# Patient Record
Sex: Female | Born: 1980 | Race: White | Hispanic: No | Marital: Single | Smoking: Current every day smoker
Health system: Southern US, Community
[De-identification: ages and names within clinical notes are randomized; demographics above are authoritative.]

## PROBLEM LIST (undated history)

## (undated) DIAGNOSIS — F32A Depression, unspecified: Secondary | ICD-10-CM

## (undated) DIAGNOSIS — I10 Essential (primary) hypertension: Secondary | ICD-10-CM

## (undated) HISTORY — PX: ABDOMINAL HYSTERECTOMY: SHX81

---

## 2014-01-08 ENCOUNTER — Emergency Department (HOSPITAL_COMMUNITY)
Admission: EM | Admit: 2014-01-08 | Discharge: 2014-01-08 | Disposition: A | Discharge status: Home / Self Care | Payer: Self-pay | Attending: Emergency Medicine | Admitting: Emergency Medicine

## 2014-01-08 ENCOUNTER — Encounter (HOSPITAL_COMMUNITY): Payer: Self-pay | Admitting: Emergency Medicine

## 2014-01-08 DIAGNOSIS — M545 Low back pain, unspecified: Secondary | ICD-10-CM | POA: Insufficient documentation

## 2014-01-08 DIAGNOSIS — Z79899 Other long term (current) drug therapy: Secondary | ICD-10-CM | POA: Insufficient documentation

## 2014-01-08 DIAGNOSIS — M5412 Radiculopathy, cervical region: Secondary | ICD-10-CM | POA: Insufficient documentation

## 2014-01-08 DIAGNOSIS — F17200 Nicotine dependence, unspecified, uncomplicated: Secondary | ICD-10-CM | POA: Insufficient documentation

## 2014-01-08 MED ORDER — IBUPROFEN 600 MG PO TABS: 600.0000 mg | ORAL_TABLET | Freq: Three times a day (TID) | ORAL | Status: AC

## 2014-01-08 MED ORDER — DIAZEPAM 5 MG PO TABS: 5.0000 mg | ORAL_TABLET | Freq: Two times a day (BID) | ORAL | Status: DC

## 2014-01-08 MED ORDER — TRAMADOL HCL 50 MG PO TABS: 50.0000 mg | ORAL_TABLET | Freq: Four times a day (QID) | ORAL | Status: DC | PRN

## 2014-01-08 NOTE — ED Provider Notes (Addendum)
CSN: [id number]     Arrival date & time 01/08/14  5056 History   First MD Initiated Contact with Patient 01/08/14 [phone]     Chief Complaint  Patient presents with  . B/L arm pain       HPI  Patient presents with new pain in both arms, mid back, low back. The pain began 3 days ago, when the patient was assisting with a heavy patient at work.  She is a Engineer, civil (consulting).  Patient strained, felt a sudden onset of pain in her midthoracic, lower back and no neck pain, but with pain in both forearms, both the second and third digits.  The pain is described as stinging, with no loss of strength, no loss of sensation.  No lower extremity changes, no abdominal pain, chest pain, unilateral swelling. Patient was in her usual state of health prior to the onset of symptoms. No relief with OTC medication thus far.   History reviewed. No pertinent past medical history. History reviewed. No pertinent past surgical history. No family history on file. History  Substance Use Topics  . Smoking status: Current Every Day Smoker  . Smokeless tobacco: Not on file  . Alcohol Use: No   OB History   Grav Para Term Preterm Abortions TAB SAB Ect Mult Living                 Review of Systems  Constitutional:       Per HPI, otherwise negative  HENT:       Per HPI, otherwise negative  Respiratory:       Per HPI, otherwise negative  Cardiovascular:       Per HPI, otherwise negative  Gastrointestinal: Negative for vomiting.  Endocrine:       Negative aside from HPI  Genitourinary:       Neg aside from HPI   Musculoskeletal:       Per HPI, otherwise negative  Skin: Negative for color change and pallor.  Neurological: Negative for dizziness, syncope, weakness, numbness and headaches.      Allergies  Codeine  Home Medications   Prior to Admission medications   Medication Sig Start Date End Date Taking? Authorizing Provider  acetaminophen (TYLENOL) 500 MG tablet Take 500 mg by mouth daily.   Yes Historical  Provider, MD  Ascorbic Acid (VITAMIN C) 1000 MG tablet Take 1,000 mg by mouth daily.   Yes Historical Provider, MD  citalopram (CELEXA) 20 MG tablet Take 20 mg by mouth daily.   Yes Historical Provider, MD  lansoprazole (PREVACID) 15 MG capsule Take 15 mg by mouth daily at 12 noon.   Yes Historical Provider, MD  diazepam (VALIUM) 5 MG tablet Take 1 tablet (5 mg total) by mouth 2 (two) times daily. 01/08/14   Gerhard Munch, MD  ibuprofen (ADVIL,MOTRIN) 600 MG tablet Take 1 tablet (600 mg total) by mouth 3 (three) times daily. 01/08/14 01/11/14  Gerhard Munch, MD  traMADol (ULTRAM) 50 MG tablet Take 1 tablet (50 mg total) by mouth every 6 (six) hours as needed. 01/08/14   Gerhard Munch, MD   BP 149/119  Pulse 106  Temp(Src) 98.1 F (36.7 C) (Oral)  Resp 18  SpO2 100%  LMP 01/01/2014 Physical Exam  Nursing note and vitals reviewed. Constitutional: She is oriented to person, place, and time. She appears well-developed and well-nourished. No distress.  HENT:  Head: Normocephalic and atraumatic.  Eyes: Conjunctivae and EOM are normal.  Cardiovascular: Normal rate, regular rhythm and intact distal pulses.  No murmur heard. Pulses:      Radial pulses are 2+ on the right side, and 2+ on the left side.  Pulmonary/Chest: Effort normal and breath sounds normal. No stridor. No respiratory distress.  Abdominal: She exhibits no distension.  Musculoskeletal: She exhibits no edema.       Cervical back: Normal.  Patient notes pain throughout the c6 distribution symmetrically. She moves both hands / wrists appropriately, with no loss of ROM / strength. Elbows / shoulder WNL   Neurological: She is alert and oriented to person, place, and time. She displays no atrophy and no tremor. No cranial nerve deficit or sensory deficit. She exhibits normal muscle tone. She displays no seizure activity. Coordination normal.  Skin: Skin is warm and dry.  Psychiatric: She has a normal mood and affect.    ED  Course  Procedures (including critical care time)    MDM   Final diagnoses:  Acute cervical radiculopathy    Patient presents with acute pain in both arms, in the mid back.  Patient has no risk factors for vascular structure, has appropriate pulses in both extremities, no pallor.  Asymmetry of her discomfort, as well as her description of sudden onset suggests acute radiculopathy.  Patient is neurologically intact and hemodynamically stable, awake, alert, appropriate for discharge after initiation of therapy.    Gerhard Munchobert Teran Knittle, MD 01/08/14 1025  Gerhard Munchobert Darlina Mccaughey, MD 01/08/14 [phone]

## 2014-01-08 NOTE — Discharge Instructions (Signed)
° ° °  Your bilateral arm pain is likely due to an acute radiculopathy.  However, with consideration for other possible causes, please make sure to monitor your condition carefully, do not hesitate return here if you do, or concerning changes in your condition.    Radicular Pain Radicular pain in either the arm or leg is usually from a bulging or herniated disk in the spine. A piece of the herniated disk may press against the nerves as the nerves exit the spine. This causes pain which is felt at the tips of the nerves down the arm or leg. Other causes of radicular pain may include:  Fractures.  Heart disease.  Cancer.  An abnormal and usually degenerative state of the nervous system or nerves (neuropathy). Diagnosis may require CT or MRI scanning to determine the primary cause.  Nerves that start at the neck (nerve roots) may cause radicular pain in the outer shoulder and arm. It can spread down to the thumb and fingers. The symptoms vary depending on which nerve root has been affected. In most cases radicular pain improves with conservative treatment. Neck problems may require physical therapy, a neck collar, or cervical traction. Treatment may take many weeks, and surgery may be considered if the symptoms do not improve.  Conservative treatment is also recommended for sciatica. Sciatica causes pain to radiate from the lower back or buttock area down the leg into the foot. Often there is a history of back problems. Most patients with sciatica are better after 2 to 4 weeks of rest and other supportive care. Short term bed rest can reduce the disk pressure considerably. Sitting, however, is not a good position since this increases the pressure on the disk. You should avoid bending, lifting, and all other activities which make the problem worse. Traction can be used in severe cases. Surgery is usually reserved for patients who do not improve within the first months of treatment. Only take  over-the-counter or prescription medicines for pain, discomfort, or fever as directed by your caregiver. Narcotics and muscle relaxants may help by relieving more severe pain and spasm and by providing mild sedation. Cold or massage can give significant relief. Spinal manipulation is not recommended. It can increase the degree of disc protrusion. Epidural steroid injections are often effective treatment for radicular pain. These injections deliver medicine to the spinal nerve in the space between the protective covering of the spinal cord and back bones (vertebrae). Your caregiver can give you more information about steroid injections. These injections are most effective when given within two weeks of the onset of pain.  You should see your caregiver for follow up care as recommended. A program for neck and back injury rehabilitation with stretching and strengthening exercises is an important part of management.  SEEK IMMEDIATE MEDICAL CARE IF:  You develop increased pain, weakness, or numbness in your arm or leg.  You develop difficulty with bladder or bowel control.  You develop abdominal pain. Document Released: 09/06/2004 Document Revised: 10/22/2011 Document Reviewed: 11/22/2008 [facility] Patient Information 2014 [place], [place].

## 2014-01-08 NOTE — ED Notes (Signed)
Per pt, states she was transferring a patient at work and did not know if she injured neck/back-now having B/L arm pain, "feels like bees are stinging her arms/hands/fingers

## 2014-01-08 NOTE — ED Notes (Signed)
MD at bedside. 

## 2014-05-22 ENCOUNTER — Encounter (HOSPITAL_COMMUNITY): Payer: Self-pay | Admitting: Emergency Medicine

## 2014-05-22 ENCOUNTER — Emergency Department (HOSPITAL_COMMUNITY)
Admission: EM | Admit: 2014-05-22 | Discharge: 2014-05-23 | Disposition: A | Discharge status: Home / Self Care | Payer: Self-pay | Attending: Emergency Medicine | Admitting: Emergency Medicine

## 2014-05-22 DIAGNOSIS — Z3202 Encounter for pregnancy test, result negative: Secondary | ICD-10-CM | POA: Insufficient documentation

## 2014-05-22 DIAGNOSIS — Y92481 Parking lot as the place of occurrence of the external cause: Secondary | ICD-10-CM | POA: Insufficient documentation

## 2014-05-22 DIAGNOSIS — Z791 Long term (current) use of non-steroidal anti-inflammatories (NSAID): Secondary | ICD-10-CM | POA: Insufficient documentation

## 2014-05-22 DIAGNOSIS — Z23 Encounter for immunization: Secondary | ICD-10-CM | POA: Insufficient documentation

## 2014-05-22 DIAGNOSIS — S70211A Abrasion, right hip, initial encounter: Secondary | ICD-10-CM | POA: Insufficient documentation

## 2014-05-22 DIAGNOSIS — Y9389 Activity, other specified: Secondary | ICD-10-CM | POA: Insufficient documentation

## 2014-05-22 DIAGNOSIS — S0083XA Contusion of other part of head, initial encounter: Secondary | ICD-10-CM | POA: Insufficient documentation

## 2014-05-22 DIAGNOSIS — F10129 Alcohol abuse with intoxication, unspecified: Secondary | ICD-10-CM | POA: Insufficient documentation

## 2014-05-22 DIAGNOSIS — R000 Tachycardia, unspecified: Secondary | ICD-10-CM | POA: Insufficient documentation

## 2014-05-22 DIAGNOSIS — Z79899 Other long term (current) drug therapy: Secondary | ICD-10-CM | POA: Insufficient documentation

## 2014-05-22 DIAGNOSIS — Z720 Tobacco use: Secondary | ICD-10-CM | POA: Insufficient documentation

## 2014-05-22 DIAGNOSIS — S70212A Abrasion, left hip, initial encounter: Secondary | ICD-10-CM | POA: Insufficient documentation

## 2014-05-22 MED ORDER — SODIUM CHLORIDE 0.9 % IV BOLUS (SEPSIS)
2000.0000 mL | Freq: Once | INTRAVENOUS | Status: AC
  Administered 2014-05-23: 2000 mL via INTRAVENOUS

## 2014-05-22 MED ORDER — TETANUS-DIPHTH-ACELL PERTUSSIS 5-2.5-18.5 LF-MCG/0.5 IM SUSP
0.5000 mL | Freq: Once | INTRAMUSCULAR | Status: AC
  Administered 2014-05-23: 0.5 mL via INTRAMUSCULAR
  Filled 2014-05-22: qty 0.5
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
## 2014-05-22 NOTE — ED Notes (Signed)
[facility]

## 2014-05-22 NOTE — ED Notes (Signed)
Pt removed self from monitor. PBT drawing blood for [employer] at this time.

## 2014-05-23 ENCOUNTER — Emergency Department (HOSPITAL_COMMUNITY): Payer: Self-pay

## 2014-05-23 LAB — URINALYSIS, ROUTINE W REFLEX MICROSCOPIC
Bilirubin Urine: NEGATIVE
Glucose, UA: NEGATIVE mg/dL
Ketones, ur: NEGATIVE mg/dL
LEUKOCYTES UA: NEGATIVE
NITRITE: NEGATIVE
PH: 5.5 (ref 5.0–8.0)
Protein, ur: 100 mg/dL — AB
SPECIFIC GRAVITY, URINE: 1.008 (ref 1.005–1.030)
Urobilinogen, UA: 0.2 mg/dL (ref 0.0–1.0)

## 2014-05-23 LAB — COMPREHENSIVE METABOLIC PANEL
ALBUMIN: 3.9 g/dL (ref 3.5–5.2)
ALT: 136 U/L — ABNORMAL HIGH (ref 0–35)
AST: 184 U/L — ABNORMAL HIGH (ref 0–37)
Alkaline Phosphatase: 69 U/L (ref 39–117)
Anion gap: 16 — ABNORMAL HIGH (ref 5–15)
BUN: 9 mg/dL (ref 6–23)
CALCIUM: 9.1 mg/dL (ref 8.4–10.5)
CHLORIDE: 103 meq/L (ref 96–112)
CO2: 23 meq/L (ref 19–32)
CREATININE: 0.58 mg/dL (ref 0.50–1.10)
GFR calc Af Amer: >90 mL/min (ref >90)
GFR calc non Af Amer: >90 mL/min (ref >90)
Glucose, Bld: 138 mg/dL — ABNORMAL HIGH (ref 70–99)
Potassium: 3.7 mEq/L (ref 3.7–5.3)
Sodium: 142 mEq/L (ref 137–147)
Total Bilirubin: 0.2 mg/dL — ABNORMAL LOW (ref 0.3–1.2)
Total Protein: 7.7 g/dL (ref 6.0–8.3)

## 2014-05-23 LAB — CBC WITH DIFFERENTIAL/PLATELET
BASOS ABS: 0 10*3/uL (ref 0.0–0.1)
BASOS PCT: 0 % (ref 0–1)
EOS PCT: 1 % (ref 0–5)
Eosinophils Absolute: 0.1 10*3/uL (ref 0.0–0.7)
HCT: 41.4 % (ref 36.0–46.0)
Hemoglobin: 14.6 g/dL (ref 12.0–15.0)
LYMPHS PCT: 19 % (ref 12–46)
Lymphs Abs: 2.5 10*3/uL (ref 0.7–4.0)
MCH: 30.9 pg (ref 26.0–34.0)
MCHC: 35.3 g/dL (ref 30.0–36.0)
MCV: 87.7 fL (ref 78.0–100.0)
MONO ABS: 0.7 10*3/uL (ref 0.1–1.0)
Monocytes Relative: 6 % (ref 3–12)
NEUTROS ABS: 9.4 10*3/uL — AB (ref 1.7–7.7)
Neutrophils Relative %: 74 % (ref 43–77)
PLATELETS: 213 10*3/uL (ref 150–400)
RBC: 4.72 MIL/uL (ref 3.87–5.11)
RDW: 12.7 % (ref 11.5–15.5)
WBC: 12.7 10*3/uL — AB (ref 4.0–10.5)

## 2014-05-23 LAB — URINE MICROSCOPIC-ADD ON

## 2014-05-23 LAB — RAPID URINE DRUG SCREEN, HOSP PERFORMED
Amphetamines: NOT DETECTED
BENZODIAZEPINES: NOT DETECTED
Barbiturates: NOT DETECTED
Cocaine: NOT DETECTED
Opiates: NOT DETECTED
Tetrahydrocannabinol: NOT DETECTED

## 2014-05-23 LAB — I-STAT CG4 LACTIC ACID, ED: LACTIC ACID, VENOUS: 1.44 mmol/L (ref 0.5–2.2)

## 2014-05-23 LAB — PREGNANCY, URINE: PREG TEST UR: NEGATIVE

## 2014-05-23 LAB — ETHANOL: Alcohol, Ethyl (B): 117 mg/dL — ABNORMAL HIGH (ref 0–11)

## 2014-05-23 MED ORDER — ONDANSETRON 4 MG PO TBDP: ORAL_TABLET | ORAL | Status: AC

## 2014-05-23 MED ORDER — ONDANSETRON 4 MG PO TBDP
8.0000 mg | ORAL_TABLET | Freq: Once | ORAL | Status: DC
  Filled 2014-05-23: qty 2

## 2014-05-23 MED ORDER — HYDROCODONE-ACETAMINOPHEN 5-325 MG PO TABS: 1.0000 | ORAL_TABLET | Freq: Two times a day (BID) | ORAL | Status: AC | PRN

## 2014-05-23 MED ORDER — IOHEXOL 300 MG/ML  SOLN
100.0000 mL | Freq: Once | INTRAMUSCULAR | Status: AC | PRN
  Administered 2014-05-23: 100 mL via INTRAVENOUS

## 2014-05-23 MED ORDER — IBUPROFEN 800 MG PO TABS: 800.0000 mg | ORAL_TABLET | Freq: Three times a day (TID) | ORAL | Status: AC | PRN

## 2014-05-23 MED ORDER — SODIUM CHLORIDE 0.9 % IV BOLUS (SEPSIS)
1000.0000 mL | Freq: Once | INTRAVENOUS | Status: AC
  Administered 2014-05-23: 1000 mL via INTRAVENOUS

## 2014-05-23 MED ORDER — IBUPROFEN 800 MG PO TABS
800.0000 mg | ORAL_TABLET | Freq: Once | ORAL | Status: AC
  Administered 2014-05-23: 800 mg via ORAL
  Filled 2014-05-23: qty 1
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
## 2014-05-23 NOTE — ED Notes (Addendum)
Lying prone, sleeping, arousable to voice, NAD, calm. Pt has removed self from monitor. 1st liter infused complete,  2nd liter infusing.

## 2014-05-23 NOTE — ED Notes (Signed)
[employer] back in to see pt. Pt upset emotional, "remembers nothing, hurts everywhere". VSS. BP elevated. HR improved lower 117.

## 2014-05-23 NOTE — ED Notes (Signed)
Mother called at request per pt: Christian MateCindy Butler [phone]

## 2014-05-23 NOTE — ED Notes (Addendum)
Pt emotional, crying, restless, no changes. Pending CT. [facility]. Up with assistance. Unsteady on feet. Fall risk explained. Yellow socks re-placed back on pt. Back to stretcher w/o incident. C/o pain with movement. Points to areas of abrasions R inguinal area, L neck, also general soreness and body aches. Pt re-oriented.
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
## 2014-05-23 NOTE — ED Notes (Addendum)
Pt in xray/CT 
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
## 2014-05-23 NOTE — ED Notes (Signed)
[facility]i [facility].

## 2014-05-23 NOTE — ED Notes (Signed)
Pt ambulatory in room, up to sink for drink of water.
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
## 2014-05-23 NOTE — ED Notes (Signed)
Semi cooperative, easily redirected, mildly restless, NAD. [facility]
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
## 2021-09-08 ENCOUNTER — Other Ambulatory Visit: Payer: Self-pay

## 2021-09-08 ENCOUNTER — Emergency Department (HOSPITAL_COMMUNITY)
Admission: EM | Admit: 2021-09-08 | Discharge: 2021-09-08 | Disposition: A | Discharge status: Home / Self Care | Payer: Medicaid Other | Attending: Emergency Medicine | Admitting: Emergency Medicine

## 2021-09-08 ENCOUNTER — Encounter (HOSPITAL_COMMUNITY): Payer: Self-pay | Admitting: Emergency Medicine

## 2021-09-08 DIAGNOSIS — R197 Diarrhea, unspecified: Secondary | ICD-10-CM | POA: Insufficient documentation

## 2021-09-08 DIAGNOSIS — F32A Depression, unspecified: Secondary | ICD-10-CM | POA: Insufficient documentation

## 2021-09-08 DIAGNOSIS — Z79899 Other long term (current) drug therapy: Secondary | ICD-10-CM | POA: Diagnosis not present

## 2021-09-08 DIAGNOSIS — Z760 Encounter for issue of repeat prescription: Secondary | ICD-10-CM | POA: Diagnosis present

## 2021-09-08 DIAGNOSIS — I10 Essential (primary) hypertension: Secondary | ICD-10-CM | POA: Diagnosis not present

## 2021-09-08 DIAGNOSIS — R112 Nausea with vomiting, unspecified: Secondary | ICD-10-CM | POA: Diagnosis not present

## 2021-09-08 DIAGNOSIS — R42 Dizziness and giddiness: Secondary | ICD-10-CM | POA: Insufficient documentation

## 2021-09-08 HISTORY — DX: Essential (primary) hypertension: I10

## 2021-09-08 HISTORY — DX: Depression, unspecified: F32.A

## 2021-09-08 MED ORDER — VENLAFAXINE HCL ER 75 MG PO CP24: 75.0000 mg | ORAL_CAPSULE | Freq: Every day | ORAL | 0 refills | Status: AC

## 2021-09-08 MED ORDER — VENLAFAXINE HCL ER 37.5 MG PO CP24
75.0000 mg | ORAL_CAPSULE | Freq: Once | ORAL | Status: AC
  Administered 2021-09-08: 75 mg via ORAL
  Filled 2021-09-08: qty 2

## 2021-09-08 NOTE — ED Provider Notes (Signed)
[facility] EMERGENCY DEPARTMENT Provider Note   CSN: [id number] Arrival date & time: 09/08/21  6294     History  Chief Complaint  Patient presents with   Medication Refill    Anne Patel is a 41 y.o. female with PMHx HTN and depression who presents to the ED today for medication refill.  Patient reports she recently moved here from [place].  She states that she went to [facility] to get established and to get refill of her venlafaxine 75 mg extended release.  She states that they have to access records from her PCP in [place] and they told her it could take up until Tuesday.  She states that she took her last dose yesterday and today began having dizziness, nausea, vomiting, diarrhea.  She states that she called a more back and they told her she was likely having withdrawal from the medication and that she needed to come to the ED for further evaluation and could likely get a few days of medication refilled here.  Patient denies any SI, HI, AVH.  She states that she has been taking this medication for approximately 5 years.  She does endorse that she wants to increase her dose is why she was following up with [facility] in the first place.  She admits that she had been taking her 75 mg extended release twice daily for a total of 150 mg which is why she ran out early.   The history is provided by the patient and medical records.      Home Medications Prior to Admission medications   Medication Sig Start Date End Date Taking? Authorizing Provider  lansoprazole (PREVACID) 15 MG capsule Take 15 mg by mouth daily as needed (reflux).   Yes [provider]  lisinopril (ZESTRIL) 20 MG tablet Take 20 mg by mouth daily.   Yes [provider]  SUBOXONE 8-2 MG FILM Place 1.75 Film under the tongue daily. 08/24/21  Yes [provider]  venlafaxine XR (EFFEXOR-XR) 75 MG 24 hr capsule Take 75 mg by mouth daily. 08/23/21  Yes [provider]  venlafaxine XR  (EFFEXOR-XR) 75 MG 24 hr capsule Take 1 capsule (75 mg total) by mouth daily with breakfast for 7 days. 09/08/21 09/15/21 Yes Elaiza Shoberg, PA-C  HYDROcodone-acetaminophen (NORCO/VICODIN) 5-325 MG per tablet Take 1 tablet by mouth 2 (two) times daily as needed for severe pain. Patient not taking: Reported on 09/08/2021 05/23/14   Tomasita Crumble, MD  ibuprofen (ADVIL,MOTRIN) 800 MG tablet Take 1 tablet (800 mg total) by mouth every 8 (eight) hours as needed for moderate pain. Patient not taking: Reported on 09/08/2021 05/23/14   Tomasita Crumble, MD  ondansetron (ZOFRAN ODT) 4 MG disintegrating tablet 4mg  ODT q4 hours prn nausea/vomit Patient not taking: Reported on 09/08/2021 05/23/14   07/23/14, MD      Allergies    Codeine    Review of Systems   Review of Systems  Gastrointestinal:  Positive for diarrhea, nausea and vomiting. Negative for abdominal pain.  Neurological:  Positive for dizziness.  Psychiatric/Behavioral:  Negative for hallucinations and suicidal ideas.   All other systems reviewed and are negative.  Physical Exam Updated Vital Signs BP (!) 150/107 (BP Location: Left Arm) Comment: notified MD, patient reports increased anxiety   Pulse 96    Temp 98.5 F (36.9 C) (Oral)    Resp (!) 24    Ht 5\' 3"  (1.6 m)    Wt 86.2 kg    SpO2 96%  BMI 33.66 kg/m  Physical Exam Vitals and nursing note reviewed.  Constitutional:      Appearance: She is not ill-appearing or diaphoretic.  HENT:     Head: Normocephalic and atraumatic.  Eyes:     Conjunctiva/sclera: Conjunctivae normal.  Cardiovascular:     Rate and Rhythm: Normal rate and regular rhythm.     Pulses: Normal pulses.  Pulmonary:     Effort: Pulmonary effort is normal.     Breath sounds: Normal breath sounds. No wheezing, rhonchi or rales.  Skin:    General: Skin is warm and dry.     Coloration: Skin is not jaundiced.  Neurological:     Mental Status: She is alert.    ED Results / Procedures / Treatments   Labs (all  labs ordered are listed, but only abnormal results are displayed) Labs Reviewed - No data to display  EKG None  Radiology No results found.  Procedures Procedures    Medications Ordered in ED Medications  venlafaxine XR (EFFEXOR-XR) 24 hr capsule 75 mg (75 mg Oral Given 09/08/21 1031)    ED Course/ Medical Decision Making/ A&P                           Medical Decision Making 41 year old female who presents to the ED today for medication refill of her Effexor 75 mg extended release.  He moved to the area [facility] are needing to access her records from PCP before prescribing.  Currently experiencing withdrawal symptoms of nausea, vomiting, diarrhea, dizziness.  On arrival to the ED today patient's blood pressure is 150/107 however she does appear anxious at this time.  She denies missing any doses of her antihypertensive and states she took it last night.  Remainder vitals are unremarkable.  Patient denies any SI, HI, AVH.  Her abdomen is soft and nontender.  She is not actively vomiting in the ER today. Given overall well appearance I do not feel pt requires labwork at this time.  Will provide dose of Effexor in the ED and reevaluate.  We will plan to discharge home with short course however patient [facility]  She is in agreement with plan.  On reevaluation pt resting comfortable. Blood pressure has decreased into the 130's systolic. Continues to not vomit in the ED. Will discharge with 7 days worth of medication. She understands she needs to take the medication as prescribed and only take once daily. [facility]. Stable for discharge home.   Problems Addressed: Depression, unspecified depression type: acute illness or injury Medication refill: acute illness or injury  Risk Prescription drug management. Diagnosis or treatment significantly limited  by social determinants of health. Risk Details: Treatment limited due to pt recently moving to this area without established PCP.           Final Clinical Impression(s) / ED Diagnoses Final diagnoses:  Medication refill  Depression, unspecified depression type    Rx / DC Orders ED Discharge Orders          Ordered    venlafaxine XR (EFFEXOR-XR) 75 MG 24 hr capsule  Daily with breakfast        09/08/21 1110             Discharge Instructions      Please pick up medication and take as prescribed once daily.  Follow up with [facility] for further refills.   Return to the ED for any new/worsening symptoms       Tanda Rockers, Cordelia Poche 09/08/21 1111    Bethann Berkshire, MD 09/09/21 [phone]

## 2021-09-08 NOTE — ED Triage Notes (Signed)
Pt arrives from home after being sent by [facility] for Venlafaxine refill because they were waiting on medical records from pcp Dr. Orest Dikes and signatures that they said could take over 3 days to get. Pt reports taking 75mg  BID with last dose at 1130am yesterday. Pt reports feeling dizzy and was told she is having withdrawal from medication.

## 2021-09-08 NOTE — Discharge Instructions (Signed)
Please pick up medication and take as prescribed once daily.   Follow up with [facility] for further refills.   Return to the ED for any new/worsening symptoms

## 2024-04-14 ENCOUNTER — Ambulatory Visit: Admitting: Nurse Practitioner

## 2024-04-22 ENCOUNTER — Encounter: Payer: Self-pay | Admitting: Nurse Practitioner
